# Patient Record
Sex: Male | Born: 2000 | Race: White | Hispanic: No | Marital: Single
Health system: Southern US, Community
[De-identification: ages and names within clinical notes are randomized; demographics above are authoritative.]

## PROBLEM LIST (undated history)

## (undated) DIAGNOSIS — F32A Depression, unspecified: Secondary | ICD-10-CM

## (undated) DIAGNOSIS — F329 Major depressive disorder, single episode, unspecified: Secondary | ICD-10-CM

---
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
## 2020-04-01 DIAGNOSIS — J3089 Other allergic rhinitis: Secondary | ICD-10-CM | POA: Diagnosis not present

## 2020-04-01 DIAGNOSIS — J3081 Allergic rhinitis due to animal (cat) (dog) hair and dander: Secondary | ICD-10-CM | POA: Diagnosis not present

## 2020-04-01 DIAGNOSIS — J4530 Mild persistent asthma, uncomplicated: Secondary | ICD-10-CM | POA: Diagnosis not present

## 2020-04-01 DIAGNOSIS — J301 Allergic rhinitis due to pollen: Secondary | ICD-10-CM | POA: Diagnosis not present

## 2020-04-24 DIAGNOSIS — Z0000 Encounter for general adult medical examination without abnormal findings: Secondary | ICD-10-CM | POA: Diagnosis not present

## 2020-04-24 DIAGNOSIS — Z23 Encounter for immunization: Secondary | ICD-10-CM | POA: Diagnosis not present

## 2020-05-26 DIAGNOSIS — Z23 Encounter for immunization: Secondary | ICD-10-CM | POA: Diagnosis not present

## 2020-09-02 DIAGNOSIS — R059 Cough, unspecified: Secondary | ICD-10-CM | POA: Diagnosis not present

## 2020-10-13 DIAGNOSIS — J4530 Mild persistent asthma, uncomplicated: Secondary | ICD-10-CM | POA: Diagnosis not present

## 2020-10-13 DIAGNOSIS — J3081 Allergic rhinitis due to animal (cat) (dog) hair and dander: Secondary | ICD-10-CM | POA: Diagnosis not present

## 2020-10-13 DIAGNOSIS — J301 Allergic rhinitis due to pollen: Secondary | ICD-10-CM | POA: Diagnosis not present

## 2020-10-13 DIAGNOSIS — J3089 Other allergic rhinitis: Secondary | ICD-10-CM | POA: Diagnosis not present

## 2021-05-21 DIAGNOSIS — U071 COVID-19: Secondary | ICD-10-CM | POA: Diagnosis not present

## 2021-10-13 DIAGNOSIS — J4530 Mild persistent asthma, uncomplicated: Secondary | ICD-10-CM | POA: Diagnosis not present

## 2021-10-13 DIAGNOSIS — J3081 Allergic rhinitis due to animal (cat) (dog) hair and dander: Secondary | ICD-10-CM | POA: Diagnosis not present

## 2021-10-13 DIAGNOSIS — J3089 Other allergic rhinitis: Secondary | ICD-10-CM | POA: Diagnosis not present

## 2021-10-13 DIAGNOSIS — J301 Allergic rhinitis due to pollen: Secondary | ICD-10-CM | POA: Diagnosis not present

## 2022-02-10 DIAGNOSIS — F840 Autistic disorder: Secondary | ICD-10-CM | POA: Diagnosis not present

## 2022-02-10 DIAGNOSIS — F411 Generalized anxiety disorder: Secondary | ICD-10-CM | POA: Diagnosis not present

## 2022-02-15 DIAGNOSIS — F411 Generalized anxiety disorder: Secondary | ICD-10-CM | POA: Diagnosis not present

## 2022-02-15 DIAGNOSIS — F840 Autistic disorder: Secondary | ICD-10-CM | POA: Diagnosis not present

## 2022-02-23 DIAGNOSIS — F411 Generalized anxiety disorder: Secondary | ICD-10-CM | POA: Diagnosis not present

## 2022-02-23 DIAGNOSIS — F840 Autistic disorder: Secondary | ICD-10-CM | POA: Diagnosis not present

## 2022-04-01 DIAGNOSIS — F840 Autistic disorder: Secondary | ICD-10-CM | POA: Diagnosis not present

## 2022-04-01 DIAGNOSIS — F411 Generalized anxiety disorder: Secondary | ICD-10-CM | POA: Diagnosis not present

## 2022-04-15 DIAGNOSIS — F840 Autistic disorder: Secondary | ICD-10-CM | POA: Diagnosis not present

## 2022-04-15 DIAGNOSIS — F411 Generalized anxiety disorder: Secondary | ICD-10-CM | POA: Diagnosis not present

## 2022-05-25 DIAGNOSIS — F411 Generalized anxiety disorder: Secondary | ICD-10-CM | POA: Diagnosis not present

## 2022-05-25 DIAGNOSIS — F840 Autistic disorder: Secondary | ICD-10-CM | POA: Diagnosis not present

## 2022-06-03 DIAGNOSIS — F411 Generalized anxiety disorder: Secondary | ICD-10-CM | POA: Diagnosis not present

## 2022-06-03 DIAGNOSIS — F840 Autistic disorder: Secondary | ICD-10-CM | POA: Diagnosis not present

## 2022-06-22 DIAGNOSIS — F840 Autistic disorder: Secondary | ICD-10-CM | POA: Diagnosis not present

## 2022-06-22 DIAGNOSIS — F411 Generalized anxiety disorder: Secondary | ICD-10-CM | POA: Diagnosis not present

## 2022-07-20 DIAGNOSIS — F840 Autistic disorder: Secondary | ICD-10-CM | POA: Diagnosis not present

## 2022-07-20 DIAGNOSIS — F411 Generalized anxiety disorder: Secondary | ICD-10-CM | POA: Diagnosis not present

## 2022-07-29 DIAGNOSIS — F411 Generalized anxiety disorder: Secondary | ICD-10-CM | POA: Diagnosis not present

## 2022-07-29 DIAGNOSIS — F840 Autistic disorder: Secondary | ICD-10-CM | POA: Diagnosis not present

## 2022-08-12 DIAGNOSIS — F840 Autistic disorder: Secondary | ICD-10-CM | POA: Diagnosis not present

## 2022-08-12 DIAGNOSIS — F411 Generalized anxiety disorder: Secondary | ICD-10-CM | POA: Diagnosis not present

## 2022-08-19 DIAGNOSIS — F840 Autistic disorder: Secondary | ICD-10-CM | POA: Diagnosis not present

## 2022-08-19 DIAGNOSIS — F411 Generalized anxiety disorder: Secondary | ICD-10-CM | POA: Diagnosis not present

## 2022-09-02 DIAGNOSIS — F411 Generalized anxiety disorder: Secondary | ICD-10-CM | POA: Diagnosis not present

## 2022-09-02 DIAGNOSIS — F840 Autistic disorder: Secondary | ICD-10-CM | POA: Diagnosis not present

## 2022-09-06 DIAGNOSIS — J45998 Other asthma: Secondary | ICD-10-CM | POA: Diagnosis not present

## 2022-09-06 DIAGNOSIS — J00 Acute nasopharyngitis [common cold]: Secondary | ICD-10-CM | POA: Diagnosis not present

## 2022-09-06 DIAGNOSIS — R062 Wheezing: Secondary | ICD-10-CM | POA: Diagnosis not present

## 2022-09-06 DIAGNOSIS — J029 Acute pharyngitis, unspecified: Secondary | ICD-10-CM | POA: Diagnosis not present

## 2022-09-09 DIAGNOSIS — F840 Autistic disorder: Secondary | ICD-10-CM | POA: Diagnosis not present

## 2022-09-09 DIAGNOSIS — F411 Generalized anxiety disorder: Secondary | ICD-10-CM | POA: Diagnosis not present

## 2022-10-01 DIAGNOSIS — F411 Generalized anxiety disorder: Secondary | ICD-10-CM | POA: Diagnosis not present

## 2022-10-01 DIAGNOSIS — F840 Autistic disorder: Secondary | ICD-10-CM | POA: Diagnosis not present

## 2022-10-07 DIAGNOSIS — F411 Generalized anxiety disorder: Secondary | ICD-10-CM | POA: Diagnosis not present

## 2022-10-07 DIAGNOSIS — F840 Autistic disorder: Secondary | ICD-10-CM | POA: Diagnosis not present

## 2022-10-12 DIAGNOSIS — J3081 Allergic rhinitis due to animal (cat) (dog) hair and dander: Secondary | ICD-10-CM | POA: Diagnosis not present

## 2022-10-12 DIAGNOSIS — J301 Allergic rhinitis due to pollen: Secondary | ICD-10-CM | POA: Diagnosis not present

## 2022-10-12 DIAGNOSIS — J4530 Mild persistent asthma, uncomplicated: Secondary | ICD-10-CM | POA: Diagnosis not present

## 2022-10-12 DIAGNOSIS — J3089 Other allergic rhinitis: Secondary | ICD-10-CM | POA: Diagnosis not present

## 2022-10-14 DIAGNOSIS — F411 Generalized anxiety disorder: Secondary | ICD-10-CM | POA: Diagnosis not present

## 2022-10-14 DIAGNOSIS — F840 Autistic disorder: Secondary | ICD-10-CM | POA: Diagnosis not present

## 2022-11-04 DIAGNOSIS — F411 Generalized anxiety disorder: Secondary | ICD-10-CM | POA: Diagnosis not present

## 2022-11-04 DIAGNOSIS — F840 Autistic disorder: Secondary | ICD-10-CM | POA: Diagnosis not present

## 2022-11-11 DIAGNOSIS — F411 Generalized anxiety disorder: Secondary | ICD-10-CM | POA: Diagnosis not present

## 2022-11-11 DIAGNOSIS — F840 Autistic disorder: Secondary | ICD-10-CM | POA: Diagnosis not present

## 2022-11-25 DIAGNOSIS — F840 Autistic disorder: Secondary | ICD-10-CM | POA: Diagnosis not present

## 2022-11-25 DIAGNOSIS — F411 Generalized anxiety disorder: Secondary | ICD-10-CM | POA: Diagnosis not present

## 2022-12-02 DIAGNOSIS — F840 Autistic disorder: Secondary | ICD-10-CM | POA: Diagnosis not present

## 2022-12-02 DIAGNOSIS — F411 Generalized anxiety disorder: Secondary | ICD-10-CM | POA: Diagnosis not present

## 2022-12-16 DIAGNOSIS — F411 Generalized anxiety disorder: Secondary | ICD-10-CM | POA: Diagnosis not present

## 2022-12-16 DIAGNOSIS — F840 Autistic disorder: Secondary | ICD-10-CM | POA: Diagnosis not present

## 2022-12-30 DIAGNOSIS — F840 Autistic disorder: Secondary | ICD-10-CM | POA: Diagnosis not present

## 2022-12-30 DIAGNOSIS — F411 Generalized anxiety disorder: Secondary | ICD-10-CM | POA: Diagnosis not present

## 2023-01-06 DIAGNOSIS — F840 Autistic disorder: Secondary | ICD-10-CM | POA: Diagnosis not present

## 2023-01-06 DIAGNOSIS — F411 Generalized anxiety disorder: Secondary | ICD-10-CM | POA: Diagnosis not present

## 2023-01-13 DIAGNOSIS — F840 Autistic disorder: Secondary | ICD-10-CM | POA: Diagnosis not present

## 2023-01-13 DIAGNOSIS — F411 Generalized anxiety disorder: Secondary | ICD-10-CM | POA: Diagnosis not present

## 2023-01-27 DIAGNOSIS — F840 Autistic disorder: Secondary | ICD-10-CM | POA: Diagnosis not present

## 2023-01-27 DIAGNOSIS — F411 Generalized anxiety disorder: Secondary | ICD-10-CM | POA: Diagnosis not present

## 2023-02-03 DIAGNOSIS — F411 Generalized anxiety disorder: Secondary | ICD-10-CM | POA: Diagnosis not present

## 2023-02-03 DIAGNOSIS — F840 Autistic disorder: Secondary | ICD-10-CM | POA: Diagnosis not present

## 2023-02-10 DIAGNOSIS — F840 Autistic disorder: Secondary | ICD-10-CM | POA: Diagnosis not present

## 2023-02-10 DIAGNOSIS — F411 Generalized anxiety disorder: Secondary | ICD-10-CM | POA: Diagnosis not present

## 2023-02-24 DIAGNOSIS — F411 Generalized anxiety disorder: Secondary | ICD-10-CM | POA: Diagnosis not present

## 2023-02-24 DIAGNOSIS — F840 Autistic disorder: Secondary | ICD-10-CM | POA: Diagnosis not present

## 2023-07-13 DIAGNOSIS — F840 Autistic disorder: Secondary | ICD-10-CM | POA: Diagnosis not present

## 2023-07-13 DIAGNOSIS — F419 Anxiety disorder, unspecified: Secondary | ICD-10-CM | POA: Diagnosis not present

## 2023-07-20 DIAGNOSIS — F411 Generalized anxiety disorder: Secondary | ICD-10-CM | POA: Diagnosis not present

## 2023-07-20 DIAGNOSIS — F840 Autistic disorder: Secondary | ICD-10-CM | POA: Diagnosis not present

## 2023-07-26 DIAGNOSIS — F840 Autistic disorder: Secondary | ICD-10-CM | POA: Diagnosis not present

## 2023-07-26 DIAGNOSIS — F411 Generalized anxiety disorder: Secondary | ICD-10-CM | POA: Diagnosis not present

## 2023-08-09 DIAGNOSIS — F840 Autistic disorder: Secondary | ICD-10-CM | POA: Diagnosis not present

## 2023-08-09 DIAGNOSIS — F411 Generalized anxiety disorder: Secondary | ICD-10-CM | POA: Diagnosis not present

## 2023-08-15 DIAGNOSIS — F840 Autistic disorder: Secondary | ICD-10-CM | POA: Diagnosis not present

## 2023-08-15 DIAGNOSIS — Z79899 Other long term (current) drug therapy: Secondary | ICD-10-CM | POA: Diagnosis not present

## 2023-08-15 DIAGNOSIS — F419 Anxiety disorder, unspecified: Secondary | ICD-10-CM | POA: Diagnosis not present

## 2023-08-23 DIAGNOSIS — F840 Autistic disorder: Secondary | ICD-10-CM | POA: Diagnosis not present

## 2023-08-23 DIAGNOSIS — F411 Generalized anxiety disorder: Secondary | ICD-10-CM | POA: Diagnosis not present

## 2023-09-06 DIAGNOSIS — F411 Generalized anxiety disorder: Secondary | ICD-10-CM | POA: Diagnosis not present

## 2023-09-06 DIAGNOSIS — F840 Autistic disorder: Secondary | ICD-10-CM | POA: Diagnosis not present

## 2023-09-15 DIAGNOSIS — F840 Autistic disorder: Secondary | ICD-10-CM | POA: Diagnosis not present

## 2023-09-15 DIAGNOSIS — F419 Anxiety disorder, unspecified: Secondary | ICD-10-CM | POA: Diagnosis not present

## 2023-09-20 DIAGNOSIS — F840 Autistic disorder: Secondary | ICD-10-CM | POA: Diagnosis not present

## 2023-09-20 DIAGNOSIS — F411 Generalized anxiety disorder: Secondary | ICD-10-CM | POA: Diagnosis not present

## 2023-09-24 DIAGNOSIS — Z20822 Contact with and (suspected) exposure to covid-19: Secondary | ICD-10-CM | POA: Diagnosis not present

## 2023-09-24 DIAGNOSIS — J029 Acute pharyngitis, unspecified: Secondary | ICD-10-CM | POA: Diagnosis not present

## 2023-10-04 DIAGNOSIS — F411 Generalized anxiety disorder: Secondary | ICD-10-CM | POA: Diagnosis not present

## 2023-10-04 DIAGNOSIS — F840 Autistic disorder: Secondary | ICD-10-CM | POA: Diagnosis not present
